# Patient Record
Sex: Male | Born: 1945 | Race: White | Hispanic: No | Marital: Married | Smoking: Former smoker
Health system: Southern US, Community
[De-identification: ages and names within clinical notes are randomized; demographics above are authoritative.]

## PROBLEM LIST (undated history)

## (undated) DIAGNOSIS — J439 Emphysema, unspecified: Secondary | ICD-10-CM

## (undated) DIAGNOSIS — J449 Chronic obstructive pulmonary disease, unspecified: Secondary | ICD-10-CM

## (undated) DIAGNOSIS — C801 Malignant (primary) neoplasm, unspecified: Secondary | ICD-10-CM

---

## 2019-04-05 ENCOUNTER — Other Ambulatory Visit: Payer: Self-pay

## 2019-04-05 ENCOUNTER — Emergency Department (HOSPITAL_BASED_OUTPATIENT_CLINIC_OR_DEPARTMENT_OTHER): Payer: Medicare Other

## 2019-04-05 ENCOUNTER — Encounter (HOSPITAL_BASED_OUTPATIENT_CLINIC_OR_DEPARTMENT_OTHER): Payer: Self-pay | Admitting: *Deleted

## 2019-04-05 ENCOUNTER — Emergency Department (HOSPITAL_BASED_OUTPATIENT_CLINIC_OR_DEPARTMENT_OTHER)
Admission: EM | Admit: 2019-04-05 | Discharge: 2019-04-05 | Discharge status: Against Medical Advice | Payer: Medicare Other | Attending: Emergency Medicine | Admitting: Emergency Medicine

## 2019-04-05 DIAGNOSIS — R7401 Elevation of levels of liver transaminase levels: Secondary | ICD-10-CM

## 2019-04-05 DIAGNOSIS — Z79899 Other long term (current) drug therapy: Secondary | ICD-10-CM | POA: Insufficient documentation

## 2019-04-05 DIAGNOSIS — Z7982 Long term (current) use of aspirin: Secondary | ICD-10-CM | POA: Insufficient documentation

## 2019-04-05 DIAGNOSIS — J449 Chronic obstructive pulmonary disease, unspecified: Secondary | ICD-10-CM | POA: Insufficient documentation

## 2019-04-05 DIAGNOSIS — Z85118 Personal history of other malignant neoplasm of bronchus and lung: Secondary | ICD-10-CM | POA: Insufficient documentation

## 2019-04-05 DIAGNOSIS — Z87891 Personal history of nicotine dependence: Secondary | ICD-10-CM | POA: Insufficient documentation

## 2019-04-05 DIAGNOSIS — R740 Nonspecific elevation of levels of transaminase and lactic acid dehydrogenase [LDH]: Secondary | ICD-10-CM | POA: Insufficient documentation

## 2019-04-05 DIAGNOSIS — Z7984 Long term (current) use of oral hypoglycemic drugs: Secondary | ICD-10-CM | POA: Diagnosis not present

## 2019-04-05 DIAGNOSIS — R112 Nausea with vomiting, unspecified: Secondary | ICD-10-CM | POA: Insufficient documentation

## 2019-04-05 DIAGNOSIS — R52 Pain, unspecified: Secondary | ICD-10-CM

## 2019-04-05 DIAGNOSIS — R17 Unspecified jaundice: Secondary | ICD-10-CM | POA: Insufficient documentation

## 2019-04-05 DIAGNOSIS — R1011 Right upper quadrant pain: Secondary | ICD-10-CM | POA: Diagnosis present

## 2019-04-05 HISTORY — DX: Emphysema, unspecified: J43.9

## 2019-04-05 HISTORY — DX: Malignant (primary) neoplasm, unspecified: C80.1

## 2019-04-05 HISTORY — DX: Chronic obstructive pulmonary disease, unspecified: J44.9

## 2019-04-05 LAB — COMPREHENSIVE METABOLIC PANEL
ALT: 424 U/L — ABNORMAL HIGH (ref 0–44)
AST: 396 U/L — ABNORMAL HIGH (ref 15–41)
Albumin: 4.2 g/dL (ref 3.5–5.0)
Alkaline Phosphatase: 165 U/L — ABNORMAL HIGH (ref 38–126)
Anion gap: 11 (ref 5–15)
BUN: 21 mg/dL (ref 8–23)
CO2: 26 mmol/L (ref 22–32)
Calcium: 9.1 mg/dL (ref 8.9–10.3)
Chloride: 99 mmol/L (ref 98–111)
Creatinine, Ser: 1.25 mg/dL — ABNORMAL HIGH (ref 0.61–1.24)
GFR calc Af Amer: >60 mL/min (ref >60)
GFR calc non Af Amer: 57 mL/min — ABNORMAL LOW (ref >60)
Glucose, Bld: 207 mg/dL — ABNORMAL HIGH (ref 70–99)
Potassium: 4.2 mmol/L (ref 3.5–5.1)
Sodium: 136 mmol/L (ref 135–145)
Total Bilirubin: 3.9 mg/dL — ABNORMAL HIGH (ref 0.3–1.2)
Total Protein: 8.2 g/dL — ABNORMAL HIGH (ref 6.5–8.1)

## 2019-04-05 LAB — CBC WITH DIFFERENTIAL/PLATELET
Abs Immature Granulocytes: 0.03 10*3/uL (ref 0.00–0.07)
Basophils Absolute: 0 10*3/uL (ref 0.0–0.1)
Basophils Relative: 0 %
Eosinophils Absolute: 0 10*3/uL (ref 0.0–0.5)
Eosinophils Relative: 0 %
HCT: 44.1 % (ref 39.0–52.0)
Hemoglobin: 14.2 g/dL (ref 13.0–17.0)
Immature Granulocytes: 0 %
Lymphocytes Relative: 10 %
Lymphs Abs: 0.9 10*3/uL (ref 0.7–4.0)
MCH: 30.3 pg (ref 26.0–34.0)
MCHC: 32.2 g/dL (ref 30.0–36.0)
MCV: 94 fL (ref 80.0–100.0)
Monocytes Absolute: 0.6 10*3/uL (ref 0.1–1.0)
Monocytes Relative: 7 %
Neutro Abs: 7.5 10*3/uL (ref 1.7–7.7)
Neutrophils Relative %: 83 %
Platelets: 239 10*3/uL (ref 150–400)
RBC: 4.69 MIL/uL (ref 4.22–5.81)
RDW: 13 % (ref 11.5–15.5)
WBC: 9.1 10*3/uL (ref 4.0–10.5)
nRBC: 0 % (ref 0.0–0.2)

## 2019-04-05 LAB — URINALYSIS, MICROSCOPIC (REFLEX)

## 2019-04-05 LAB — URINALYSIS, ROUTINE W REFLEX MICROSCOPIC
Glucose, UA: NEGATIVE mg/dL
Hgb urine dipstick: NEGATIVE
Ketones, ur: NEGATIVE mg/dL
Leukocytes,Ua: NEGATIVE
Nitrite: NEGATIVE
Protein, ur: 30 mg/dL — AB
Specific Gravity, Urine: 1.01 (ref 1.005–1.030)
pH: 7.5 (ref 5.0–8.0)

## 2019-04-05 LAB — LIPASE, BLOOD: Lipase: 29 U/L (ref 11–51)

## 2019-04-05 MED ORDER — ONDANSETRON HCL 4 MG/2ML IJ SOLN
4.0000 mg | Freq: Once | INTRAMUSCULAR | Status: AC
  Administered 2019-04-05: 4 mg via INTRAVENOUS
  Filled 2019-04-05: qty 2

## 2019-04-05 NOTE — ED Provider Notes (Addendum)
[facility] Provider Note   CSN: [id number] Arrival date & time: 04/05/19  1157    History   Chief Complaint Chief Complaint  Patient presents with  . Abdominal Pain    HPI Christopher Harris is a 73 y.o. male.     The history is provided by the patient.  Abdominal Pain Pain location:  RUQ Pain quality: aching and cramping   Pain radiates to:  Does not radiate Pain severity:  Mild Onset quality:  Gradual Duration:  1 day Timing:  Intermittent Progression:  Waxing and waning Chronicity:  New Context: eating   Relieved by:  Nothing Worsened by:  Nothing Associated symptoms: nausea and vomiting   Associated symptoms: no chest pain, no chills, no constipation, no cough, no dysuria, no fever, no hematuria, no shortness of breath and no sore throat     Past Medical History:  Diagnosis Date  . Cancer ([place])   . COPD (chronic obstructive pulmonary disease) ([place])   . Emphysema lung ([place])     There are no active problems to display for this patient.   History reviewed. No pertinent surgical history.      Home Medications    Prior to Admission medications   Medication Sig Start Date End Date Taking? Authorizing Provider  ALBUTEROL IN Inhale into the lungs.   Yes [provider]  Amoxicillin-Pot Clavulanate (AUGMENTIN PO) Take by mouth.   Yes [provider]  aspirin 81 MG chewable tablet Chew by mouth daily.   Yes [provider]  Budesonide-Formoterol Fumarate (SYMBICORT IN) Inhale into the lungs.   Yes [provider]  Calcium Carbonate-Vitamin D (CALCIUM 500/VITAMIN D PO) Take by mouth.   Yes [provider]  finasteride (PROSCAR) 5 MG tablet Take 5 mg by mouth daily.   Yes [provider]  GLIPIZIDE ER PO Take by mouth.   Yes [provider]  IPRATROPIUM BROMIDE IN Inhale into the lungs.   Yes [provider]  omeprazole (PRILOSEC) 20 MG capsule Take 20 mg by mouth  daily.   Yes [provider]  rosuvastatin (CRESTOR) 20 MG tablet Take 20 mg by mouth daily.   Yes [provider]  traZODone (DESYREL) 150 MG tablet Take by mouth at bedtime.   Yes [provider]    Family History No family history on file.  Social History Social History   Tobacco Use  . Smoking status: Former Research scientist (life sciences)  . Smokeless tobacco: Never Used  Substance Use Topics  . Alcohol use: Yes    Frequency: Never  . Drug use: Never     Allergies   Patient has no known allergies.   Review of Systems Review of Systems  Constitutional: Negative for chills and fever.  HENT: Negative for ear pain and sore throat.   Eyes: Negative for pain and visual disturbance.  Respiratory: Negative for cough and shortness of breath.   Cardiovascular: Negative for chest pain and palpitations.  Gastrointestinal: Positive for abdominal pain, nausea and vomiting. Negative for constipation.  Genitourinary: Negative for dysuria and hematuria.  Musculoskeletal: Negative for arthralgias and back pain.  Skin: Negative for color change and rash.  Neurological: Negative for seizures and syncope.  All other systems reviewed and are negative.    Physical Exam Updated Vital Signs  ED Triage Vitals  Enc Vitals Group     BP 04/05/19 1210 (!) 159/80     Pulse Rate 04/05/19 1343 98     Resp 04/05/19 1343  18     Temp 04/05/19 1210 98.1 F (36.7 C)     Temp Source 04/05/19 1208 Oral     SpO2 04/05/19 1210 98 %     Weight 04/05/19 1204 227 lb 9.6 oz (103.2 kg)     Height 04/05/19 1204 5\' 7"  (1.702 m)     Head Circumference --      Peak Flow --      Pain Score 04/05/19 1204 0     Pain Loc --      Pain Edu? --      Excl. in [place]? --     Physical Exam Vitals signs and nursing note reviewed.  Constitutional:      General: He is not in acute distress.    Appearance: He is well-developed. He is ill-appearing.  HENT:     Head: Normocephalic and atraumatic.  Eyes:      Conjunctiva/sclera: Conjunctivae normal.  Neck:     Musculoskeletal: Neck supple.  Cardiovascular:     Rate and Rhythm: Normal rate and regular rhythm.     Heart sounds: Normal heart sounds. No murmur.  Pulmonary:     Effort: Pulmonary effort is normal. No respiratory distress.     Breath sounds: Normal breath sounds.  Abdominal:     General: There is distension.     Palpations: Abdomen is soft.     Tenderness: There is abdominal tenderness in the right upper quadrant.     Hernia: A hernia (reducible, ventral) is present.  Skin:    General: Skin is warm and dry.     Capillary Refill: Capillary refill takes less than 2 seconds.  Neurological:     General: No focal deficit present.     Mental Status: He is alert.  Psychiatric:        Mood and Affect: Mood normal.      ED Treatments / Results  Labs (all labs ordered are listed, but only abnormal results are displayed) Labs Reviewed  COMPREHENSIVE METABOLIC PANEL - Abnormal; Notable for the following components:      Result Value   Glucose, Bld 207 (*)    Creatinine, Ser 1.25 (*)    Total Protein 8.2 (*)    AST 396 (*)    ALT 424 (*)    Alkaline Phosphatase 165 (*)    Total Bilirubin 3.9 (*)    GFR calc non Af Amer 57 (*)    All other components within normal limits  URINALYSIS, ROUTINE W REFLEX MICROSCOPIC - Abnormal; Notable for the following components:   Color, Urine AMBER (*)    Bilirubin Urine MODERATE (*)    Protein, ur 30 (*)    All other components within normal limits  URINALYSIS, MICROSCOPIC (REFLEX) - Abnormal; Notable for the following components:   Bacteria, UA RARE (*)    All other components within normal limits  CBC WITH DIFFERENTIAL/PLATELET  LIPASE, BLOOD    EKG None  Radiology US Abdomen Limited Ruq  Result Date: 04/05/2019 CLINICAL DATA:  Bloating and right upper quadrant abdominal pain. EXAM: ULTRASOUND ABDOMEN LIMITED RIGHT UPPER QUADRANT COMPARISON:  None. FINDINGS: Gallbladder: There is  gallbladder sludge with multiple small gallbladder stones. There is no gallbladder wall thickening. There is pericholecystic free fluid. The sonographic Percell Miller sign is negative. The gallbladder is mildly distended. Common bile duct: Diameter: 0.5 cm Liver: Diffuse increased echogenicity with slightly heterogeneous liver. Appearance typically secondary to fatty infiltration. Fibrosis secondary consideration. No secondary findings of cirrhosis noted. No focal hepatic  lesion or intrahepatic biliary duct dilatation. Portal vein is patent on color Doppler imaging with normal direction of blood flow towards the liver. IMPRESSION: 1. Cholelithiasis with a small amount of pericholecystic free fluid, however the sonographic Percell Miller is equivocal and there is no significant gallbladder wall thickening. If there is persistent clinical concern for acute cholecystitis, follow-up with HIDA scan is recommended. Surgical consultation may be useful. 2. Diffuse increased echogenicity with slightly heterogeneous liver. Appearance typically secondary to fatty infiltration. Fibrosis secondary consideration. No secondary findings of cirrhosis noted. No focal hepatic lesion or intrahepatic biliary duct dilatation. Electronically Signed   By: Constance Holster M.D.   On: 04/05/2019 13:43    Procedures Procedures (including critical care time)  Medications Ordered in ED Medications  ondansetron (ZOFRAN) injection 4 mg (4 mg Intravenous Given 04/05/19 1219)     Initial Impression / Assessment and Plan / ED Course  I have reviewed the triage vital signs and the nursing notes.  Pertinent labs & imaging results that were available during my care of the patient were reviewed by me and considered in my medical decision making (see chart for details).     Christopher Harris is a 73 year old male with history of COPD, prostate cancer who presents to the ED with nausea, vomiting, right upper quadrant abdominal pain.  Patient sent from  primary care doctor's office for concern for acute cholecystitis.  Patient arrives with normal vitals.  No fever.  States that pain has improved.  Still feels nauseous.  Pain started last night after eating the cabbage casserole.  Patient is tender in the right upper quadrant on exam.  No other focal tenderness otherwise on abdominal exam.  He has a reducible hernia.  Denies any constipation, diarrhea.  Has been passing gas.  Less likely bowel obstruction.  Lab work showed elevated AST, ALT to around 400.  Bilirubin is about 4.  Creatinine 1.25.  However, lipase normal.  Urinalysis overall unremarkable.  No significant leukocytosis.  Right upper quadrant ultrasound was performed that showed gallstones with small amount of pericholecystic free fluid.  However there was no significant gallbladder wall thickening.  Contacted surgery and they recommend admission to hospitalist at [facility] long for further care.  They recommend that patient likely get a HIDA scan and they will evaluate the patient.   Upon evaluation of the patient he feels better.  He does not want to be admitted and does not want to have surgery.  Patient understands the risks and benefits versus staying versus leaving.  He has capacity to leave [place].  I discussed with the patient that he could have worsening symptoms which could lead to a more severe infection and inflammation causing him to ultimately die if does go to [facility] for admission and workup and possible surgery. He understands these risks and wants to leave.  Patient given return precautions and left AGAINST MEDICAL ADVICE.  Suspect patient likely has early acute cholecystitis or had a stone that made its way through.  However, suspect that condition will more likely worsen then get better on its own.  Patient understands that he was offered admission, surgery consultation, possibly surgery, pain control, hydration, further work-up to evaluate for liver enzymes and  gallbladder enzyme elevation.  Patient was hemodynamically stable throughout my care.  This chart was dictated using voice recognition software.  Despite best efforts to proofread,  errors can occur which can change the documentation meaning.    Final Clinical Impressions(s) / ED Diagnoses  Final diagnoses:  Pain  Transaminitis  Elevated bilirubin    ED Discharge Orders    None       Lennice Sites, DO 04/05/19 [place], [place], DO 04/05/19 [place], [place], DO 04/05/19 1512

## 2019-04-05 NOTE — ED Notes (Signed)
ED Provider at bedside. 

## 2019-04-05 NOTE — ED Notes (Signed)
Patient is resting comfortably. 

## 2019-04-05 NOTE — ED Triage Notes (Signed)
Bloating and abdominal pain last night. Pain is worse in his RUQ.

## 2019-04-05 NOTE — ED Notes (Signed)
MD recommends admission to hospital, pt declines admission.  Wishes to be discharged home and follow up with pmd.

## 2020-08-13 IMAGING — US ULTRASOUND ABDOMEN LIMITED
1 series · 13 of 25 positions shown · non-contrast
Comparison: None.

CLINICAL DATA: Bloating and right upper quadrant abdominal pain.

EXAM:
ULTRASOUND ABDOMEN LIMITED RIGHT UPPER QUADRANT

[Series 1: ultrasound abdomen limited · 13 of 87 slices shown]
[im 1/87]
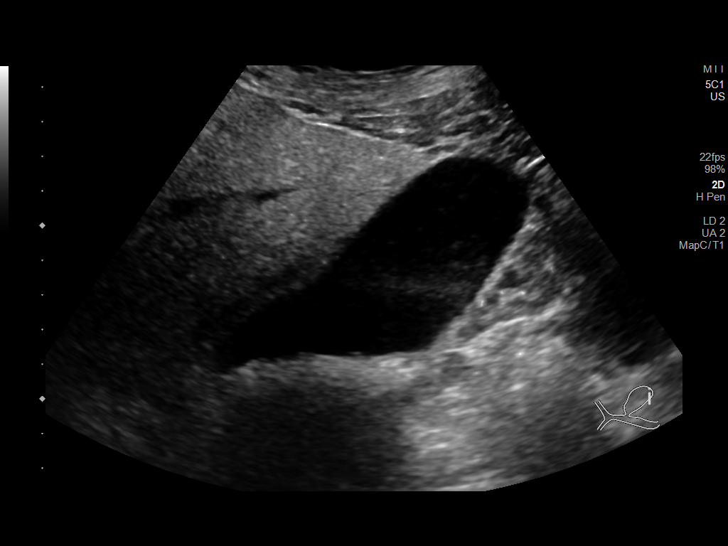
[im 8/87]
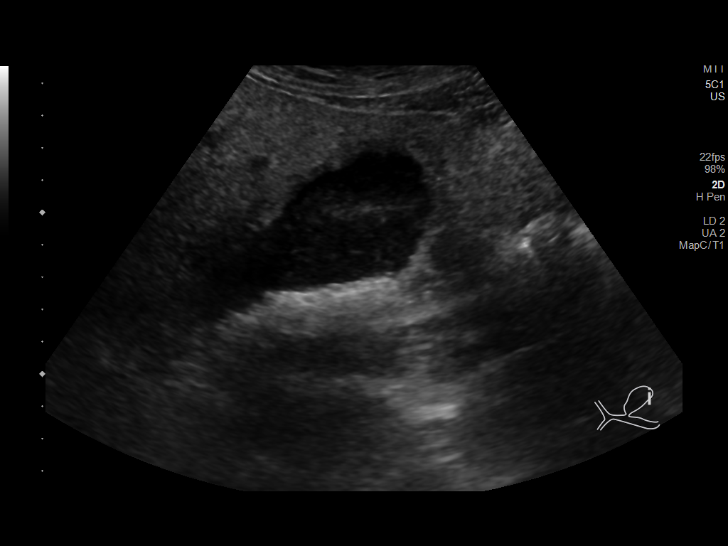
[im 15/87]
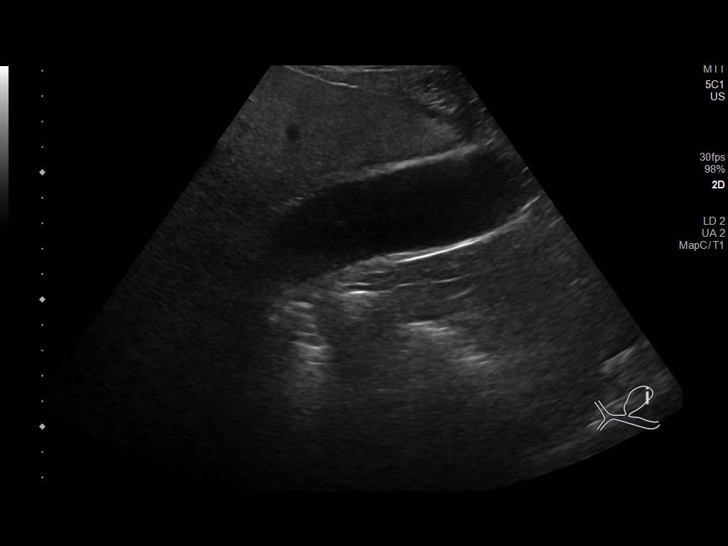
[im 22/87]
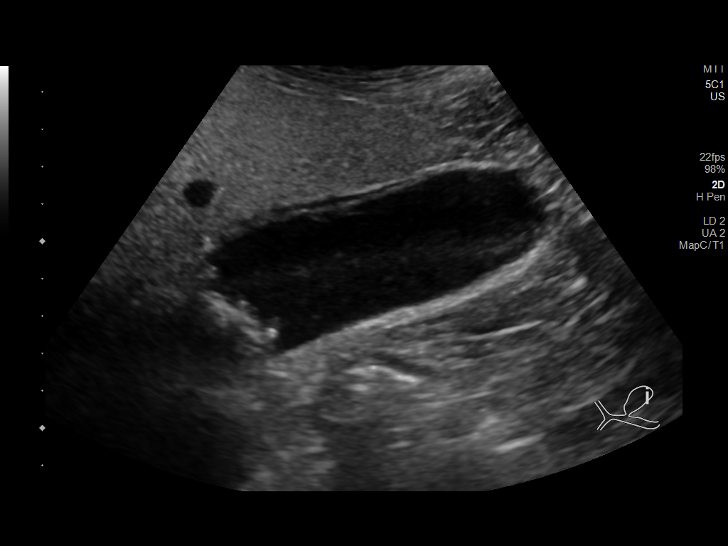
[im 29/87]
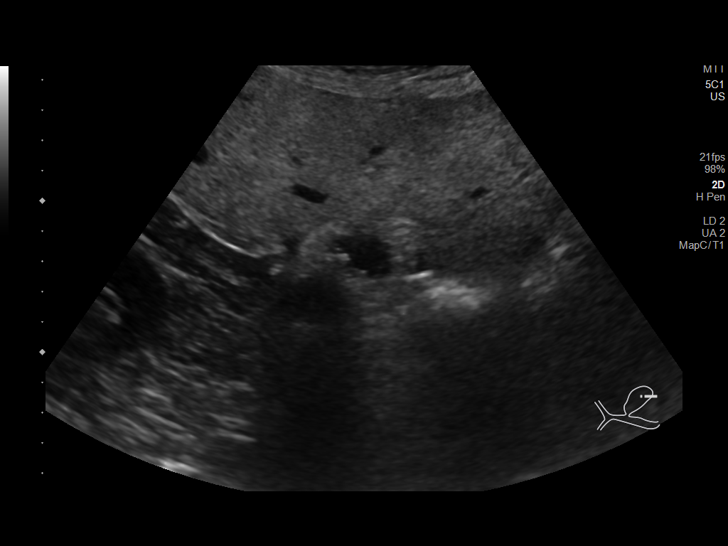
[im 36/87]
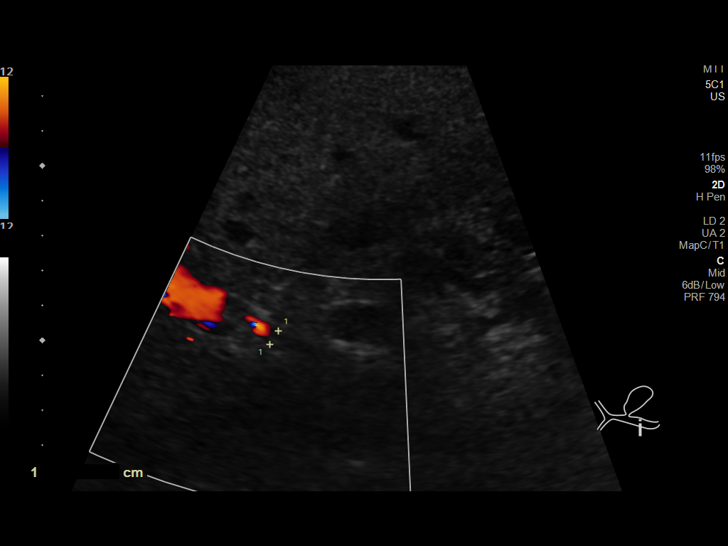
[im 44/87]
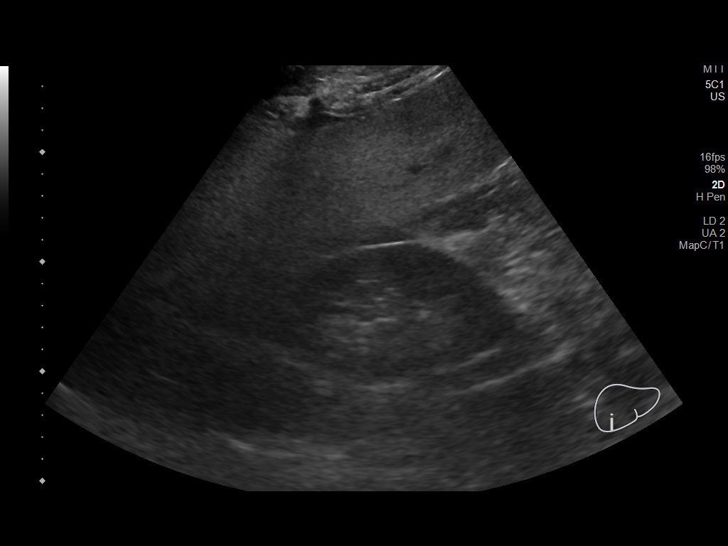
[im 51/87]
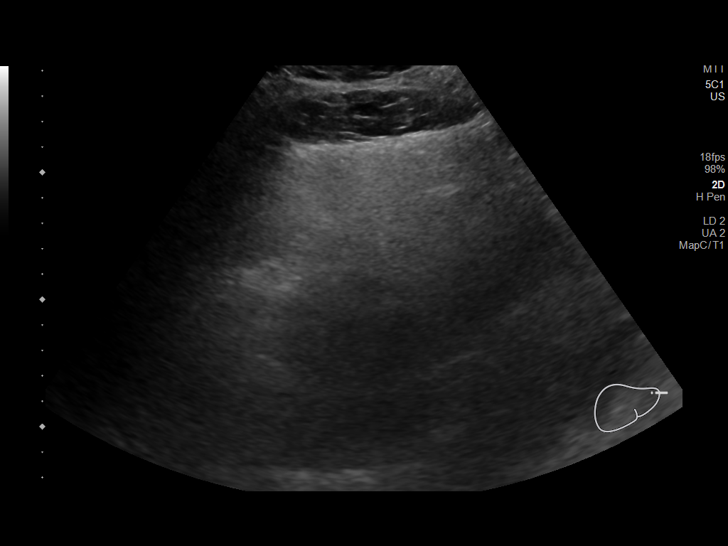
[im 58/87]
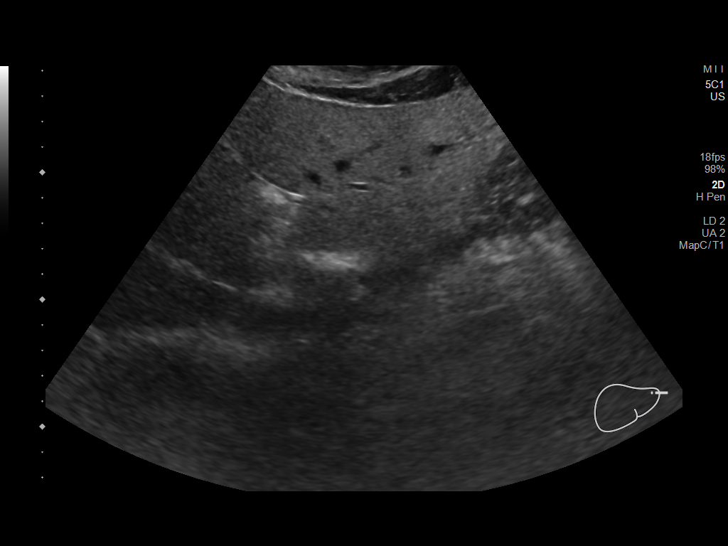
[im 65/87]
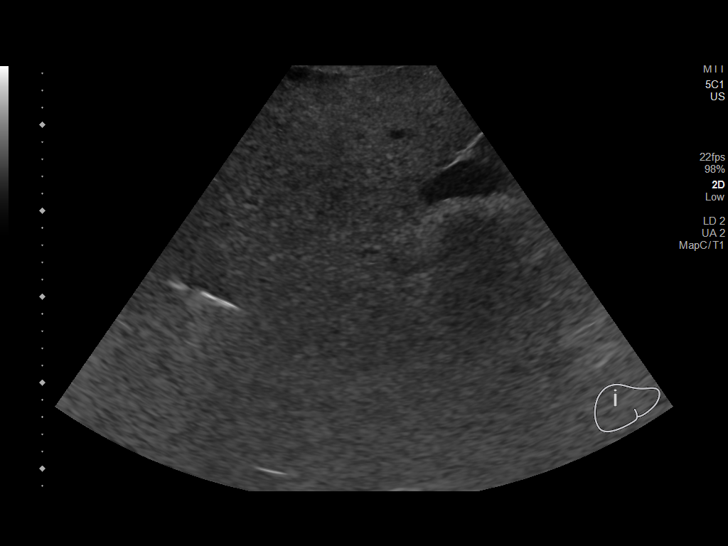
[im 72/87]
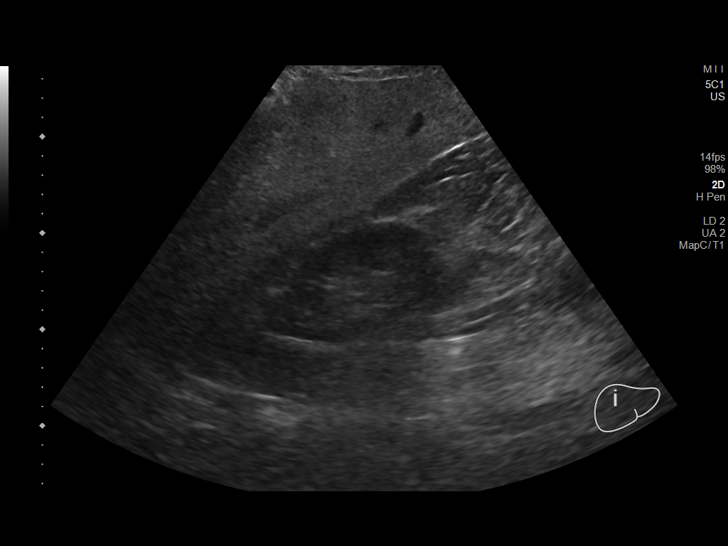
[im 79/87]
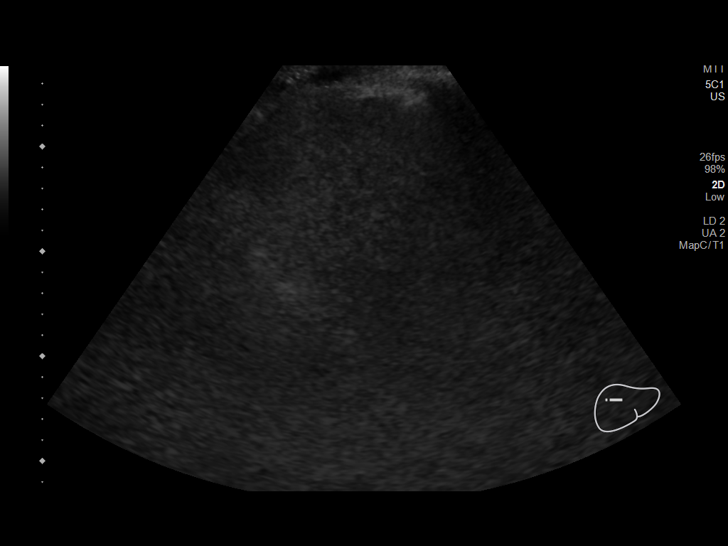
[im 87/87]
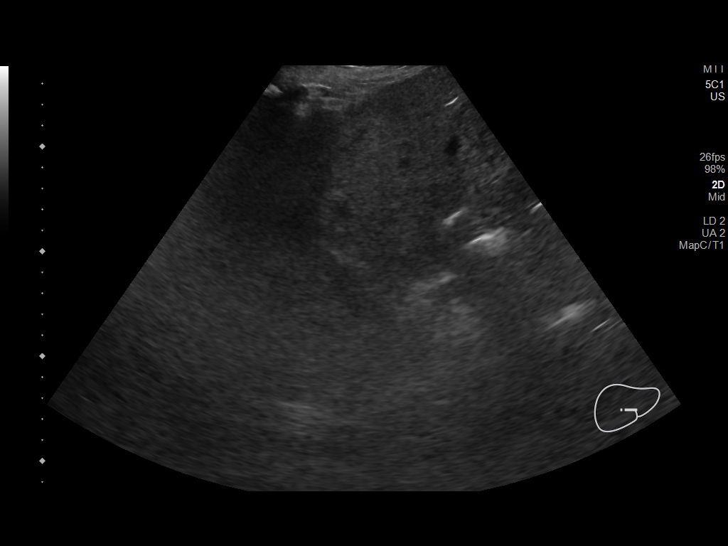

[13 of 25 positions shown; findings below may reference images not displayed]

FINDINGS: Gallbladder:

There is gallbladder sludge with multiple small gallbladder stones.
There is no gallbladder wall thickening. There is pericholecystic
free fluid. The sonographic Murphy sign is negative. The gallbladder
is mildly distended.

Common bile duct:

Diameter: 0.5 cm

Liver:

Diffuse increased echogenicity with slightly heterogeneous liver.
Appearance typically secondary to fatty infiltration. Fibrosis
secondary consideration. No secondary findings of cirrhosis noted.
No focal hepatic lesion or intrahepatic biliary duct dilatation.
Portal vein is patent on color Doppler imaging with normal direction
of blood flow towards the liver.
IMPRESSION: 1. Cholelithiasis with a small amount of pericholecystic free fluid,
however the sonographic Murphy is equivocal and there is no
significant gallbladder wall thickening. If there is persistent
clinical concern for acute cholecystitis, follow-up with HIDA scan
is recommended. Surgical consultation may be useful.
2. Diffuse increased echogenicity with slightly heterogeneous liver.
Appearance typically secondary to fatty infiltration. Fibrosis
secondary consideration. No secondary findings of cirrhosis noted.
No focal hepatic lesion or intrahepatic biliary duct dilatation.

## 2024-02-19 DEATH — deceased
